# Patient Record
Sex: Male | Born: 1940
Health system: Southern US, Community
[De-identification: ages and names within clinical notes are randomized; demographics above are authoritative.]

## PROBLEM LIST (undated history)

## (undated) DIAGNOSIS — E785 Hyperlipidemia, unspecified: Secondary | ICD-10-CM

## (undated) DIAGNOSIS — E669 Obesity, unspecified: Secondary | ICD-10-CM

## (undated) DIAGNOSIS — E119 Type 2 diabetes mellitus without complications: Secondary | ICD-10-CM

## (undated) DIAGNOSIS — I10 Essential (primary) hypertension: Secondary | ICD-10-CM

## (undated) HISTORY — DX: Type 2 diabetes mellitus without complications: E11.9

---

## 1983-05-15 HISTORY — PX: FRACTURE SURGERY: SHX138

## 2013-09-02 DIAGNOSIS — I152 Hypertension secondary to endocrine disorders: Secondary | ICD-10-CM | POA: Insufficient documentation

## 2013-09-02 DIAGNOSIS — E1159 Type 2 diabetes mellitus with other circulatory complications: Secondary | ICD-10-CM | POA: Insufficient documentation

## 2014-05-17 DIAGNOSIS — N529 Male erectile dysfunction, unspecified: Secondary | ICD-10-CM | POA: Insufficient documentation

## 2014-12-22 DIAGNOSIS — M25512 Pain in left shoulder: Secondary | ICD-10-CM | POA: Diagnosis not present

## 2014-12-27 DIAGNOSIS — M25512 Pain in left shoulder: Secondary | ICD-10-CM | POA: Insufficient documentation

## 2014-12-27 DIAGNOSIS — G8929 Other chronic pain: Secondary | ICD-10-CM | POA: Insufficient documentation

## 2014-12-27 DIAGNOSIS — M542 Cervicalgia: Secondary | ICD-10-CM | POA: Diagnosis not present

## 2015-01-04 DIAGNOSIS — M7542 Impingement syndrome of left shoulder: Secondary | ICD-10-CM | POA: Insufficient documentation

## 2015-01-04 DIAGNOSIS — G8929 Other chronic pain: Secondary | ICD-10-CM | POA: Diagnosis not present

## 2015-01-04 DIAGNOSIS — M542 Cervicalgia: Secondary | ICD-10-CM | POA: Diagnosis not present

## 2015-01-07 ENCOUNTER — Other Ambulatory Visit: Payer: Self-pay | Admitting: Unknown Physician Specialty

## 2015-01-07 DIAGNOSIS — M542 Cervicalgia: Secondary | ICD-10-CM

## 2015-01-07 DIAGNOSIS — G8929 Other chronic pain: Secondary | ICD-10-CM

## 2015-01-14 ENCOUNTER — Ambulatory Visit
Admission: RE | Admit: 2015-01-14 | Discharge: 2015-01-14 | Disposition: A | Discharge status: Home / Self Care | Payer: Commercial Managed Care - HMO | Source: Ambulatory Visit | Attending: Unknown Physician Specialty | Admitting: Unknown Physician Specialty

## 2015-01-14 DIAGNOSIS — M4802 Spinal stenosis, cervical region: Secondary | ICD-10-CM | POA: Diagnosis not present

## 2015-01-14 DIAGNOSIS — M5030 Other cervical disc degeneration, unspecified cervical region: Secondary | ICD-10-CM | POA: Insufficient documentation

## 2015-01-14 DIAGNOSIS — M542 Cervicalgia: Secondary | ICD-10-CM | POA: Insufficient documentation

## 2015-01-14 DIAGNOSIS — G8929 Other chronic pain: Secondary | ICD-10-CM | POA: Insufficient documentation

## 2015-01-14 DIAGNOSIS — M5021 Other cervical disc displacement,  high cervical region: Secondary | ICD-10-CM | POA: Diagnosis not present

## 2015-03-23 DIAGNOSIS — H1131 Conjunctival hemorrhage, right eye: Secondary | ICD-10-CM | POA: Diagnosis not present

## 2015-04-22 DIAGNOSIS — Z0000 Encounter for general adult medical examination without abnormal findings: Secondary | ICD-10-CM | POA: Diagnosis not present

## 2015-04-22 DIAGNOSIS — Z125 Encounter for screening for malignant neoplasm of prostate: Secondary | ICD-10-CM | POA: Diagnosis not present

## 2015-12-09 DIAGNOSIS — E669 Obesity, unspecified: Secondary | ICD-10-CM | POA: Diagnosis not present

## 2015-12-09 DIAGNOSIS — Z1211 Encounter for screening for malignant neoplasm of colon: Secondary | ICD-10-CM | POA: Diagnosis not present

## 2015-12-09 DIAGNOSIS — Z1212 Encounter for screening for malignant neoplasm of rectum: Secondary | ICD-10-CM | POA: Diagnosis not present

## 2015-12-09 DIAGNOSIS — L989 Disorder of the skin and subcutaneous tissue, unspecified: Secondary | ICD-10-CM | POA: Diagnosis not present

## 2016-01-11 DIAGNOSIS — Z1211 Encounter for screening for malignant neoplasm of colon: Secondary | ICD-10-CM | POA: Diagnosis not present

## 2016-01-11 DIAGNOSIS — E669 Obesity, unspecified: Secondary | ICD-10-CM | POA: Diagnosis not present

## 2016-03-12 ENCOUNTER — Encounter: Payer: Self-pay | Admitting: *Deleted

## 2016-03-13 ENCOUNTER — Ambulatory Visit: Payer: Commercial Managed Care - HMO | Admitting: Anesthesiology

## 2016-03-13 ENCOUNTER — Ambulatory Visit
Admission: RE | Admit: 2016-03-13 | Discharge: 2016-03-13 | Disposition: A | Discharge status: Home / Self Care | Payer: Commercial Managed Care - HMO | Source: Ambulatory Visit | Attending: Gastroenterology | Admitting: Gastroenterology

## 2016-03-13 ENCOUNTER — Encounter
Admission: RE | Disposition: A | Discharge status: Home / Self Care | Payer: Self-pay | Source: Ambulatory Visit | Attending: Gastroenterology

## 2016-03-13 ENCOUNTER — Encounter: Payer: Self-pay | Admitting: *Deleted

## 2016-03-13 DIAGNOSIS — K5730 Diverticulosis of large intestine without perforation or abscess without bleeding: Secondary | ICD-10-CM | POA: Diagnosis not present

## 2016-03-13 DIAGNOSIS — Z1211 Encounter for screening for malignant neoplasm of colon: Secondary | ICD-10-CM | POA: Insufficient documentation

## 2016-03-13 DIAGNOSIS — D120 Benign neoplasm of cecum: Secondary | ICD-10-CM | POA: Insufficient documentation

## 2016-03-13 DIAGNOSIS — I10 Essential (primary) hypertension: Secondary | ICD-10-CM | POA: Diagnosis not present

## 2016-03-13 DIAGNOSIS — F17200 Nicotine dependence, unspecified, uncomplicated: Secondary | ICD-10-CM | POA: Insufficient documentation

## 2016-03-13 DIAGNOSIS — Z6829 Body mass index (BMI) 29.0-29.9, adult: Secondary | ICD-10-CM | POA: Diagnosis not present

## 2016-03-13 DIAGNOSIS — D122 Benign neoplasm of ascending colon: Secondary | ICD-10-CM | POA: Insufficient documentation

## 2016-03-13 DIAGNOSIS — K635 Polyp of colon: Secondary | ICD-10-CM | POA: Insufficient documentation

## 2016-03-13 DIAGNOSIS — E785 Hyperlipidemia, unspecified: Secondary | ICD-10-CM | POA: Insufficient documentation

## 2016-03-13 DIAGNOSIS — Z79899 Other long term (current) drug therapy: Secondary | ICD-10-CM | POA: Insufficient documentation

## 2016-03-13 DIAGNOSIS — K5790 Diverticulosis of intestine, part unspecified, without perforation or abscess without bleeding: Secondary | ICD-10-CM | POA: Diagnosis not present

## 2016-03-13 DIAGNOSIS — E669 Obesity, unspecified: Secondary | ICD-10-CM | POA: Diagnosis not present

## 2016-03-13 DIAGNOSIS — K621 Rectal polyp: Secondary | ICD-10-CM | POA: Diagnosis not present

## 2016-03-13 DIAGNOSIS — D128 Benign neoplasm of rectum: Secondary | ICD-10-CM | POA: Diagnosis not present

## 2016-03-13 DIAGNOSIS — D123 Benign neoplasm of transverse colon: Secondary | ICD-10-CM | POA: Diagnosis not present

## 2016-03-13 HISTORY — DX: Essential (primary) hypertension: I10

## 2016-03-13 HISTORY — PX: COLONOSCOPY WITH PROPOFOL: SHX5780

## 2016-03-13 HISTORY — DX: Hyperlipidemia, unspecified: E78.5

## 2016-03-13 HISTORY — DX: Obesity, unspecified: E66.9

## 2016-03-13 SURGERY — COLONOSCOPY WITH PROPOFOL
Anesthesia: General

## 2016-03-13 MED ORDER — SODIUM CHLORIDE 0.9 % IV SOLN
INTRAVENOUS | Status: DC
  Administered 2016-03-13: 1000 mL via INTRAVENOUS

## 2016-03-13 MED ORDER — EPHEDRINE SULFATE 50 MG/ML IJ SOLN
INTRAMUSCULAR | Status: DC | PRN
  Administered 2016-03-13 (×3): 10 mg via INTRAVENOUS

## 2016-03-13 MED ORDER — MIDAZOLAM HCL 2 MG/2ML IJ SOLN
INTRAMUSCULAR | Status: DC | PRN
  Administered 2016-03-13: 1 mg via INTRAVENOUS

## 2016-03-13 MED ORDER — FENTANYL CITRATE (PF) 100 MCG/2ML IJ SOLN
INTRAMUSCULAR | Status: DC | PRN
  Administered 2016-03-13: 50 ug via INTRAVENOUS

## 2016-03-13 MED ORDER — SODIUM CHLORIDE 0.9 % IV SOLN
INTRAVENOUS | Status: DC
  Administered 2016-03-13: 13:00:00 via INTRAVENOUS

## 2016-03-13 MED ORDER — LIDOCAINE HCL (CARDIAC) 20 MG/ML IV SOLN
INTRAVENOUS | Status: DC | PRN
  Administered 2016-03-13: 30 mg via INTRAVENOUS

## 2016-03-13 MED ORDER — PROPOFOL 500 MG/50ML IV EMUL
INTRAVENOUS | Status: DC | PRN
  Administered 2016-03-13: 120 ug/kg/min via INTRAVENOUS

## 2016-03-13 NOTE — Anesthesia Procedure Notes (Signed)
Performed by: COOK-MARTIN, Nicha Hemann Pre-anesthesia Checklist: Patient identified, Emergency Drugs available, Suction available, Patient being monitored and Timeout performed Patient Re-evaluated:Patient Re-evaluated prior to inductionOxygen Delivery Method: Nasal cannula Preoxygenation: Pre-oxygenation with 100% oxygen Intubation Type: IV induction Placement Confirmation: CO2 detector and positive ETCO2       

## 2016-03-13 NOTE — Op Note (Signed)
[facility] Gastroenterology Patient Name: Jeremiah Ford Procedure Date: 03/13/2016 2:12 PM MRN: [id number] Account #: [id number] Date of Birth: 1940-06-26 Admit Type: Outpatient Age: 75 Room: [facility] ENDO ROOM 3 Gender: Male Note Status: Finalized Procedure:            Colonoscopy Indications:          Screening for colorectal malignant neoplasm, This is                        the patient's first colonoscopy Providers:            Lollie Sails, MD Referring MD:         No Local Md, MD (Referring MD) Medicines:            Monitored Anesthesia Care Complications:        No immediate complications. Procedure:            Pre-Anesthesia Assessment:                       - ASA Grade Assessment: II - A patient with mild                        systemic disease.                       After obtaining informed consent, the colonoscope was                        passed under direct vision. Throughout the procedure,                        the patient's blood pressure, pulse, and oxygen                        saturations were monitored continuously. The                        Colonoscope was introduced through the anus and                        advanced to the the cecum, identified by appendiceal                        orifice and ileocecal valve. The quality of the bowel                        preparation was fair. Findings:      A 2 mm polyp was found in the hepatic flexure. The polyp was sessile.       The polyp was removed with a cold biopsy forceps. Resection and       retrieval were complete.      Two sessile polyps were found in the cecum. The polyps were 3 to 4 mm in       size. These polyps were removed with a cold snare. Resection and       retrieval were complete.      Four flat polyps were found in the proximal ascending colon. The polyps       were 1 to 3 mm in size. These polyps were removed with a cold biopsy       forceps. Resection and  retrieval were  complete.      Two sessile polyps were found in the cecum. The polyps were 1 to 3 mm in       size. These polyps were removed with a cold biopsy forceps. Resection       and retrieval were complete.      A 4 mm polyp was found in the recto-sigmoid colon. The polyp was       sessile. The polyp was removed with a cold snare. Resection and       retrieval were complete.      Three sessile polyps were found in the rectum. The polyps were 2 to 3 mm       in size. These polyps were removed with a cold biopsy forceps. Resection       and retrieval were complete.      The digital rectal exam was normal.      Multiple medium-mouthed diverticula were found in the sigmoid colon,       descending colon, transverse colon, ascending colon and cecum.      abnormal appearance of the appendix, the orifice being closed with a       puffy appearance circumferentially. Impression:           - Preparation of the colon was fair.                       - One 2 mm polyp at the hepatic flexure, removed with a                        cold biopsy forceps. Resected and retrieved.                       - Two 3 to 4 mm polyps in the cecum, removed with a                        cold snare. Resected and retrieved.                       - Four 1 to 3 mm polyps in the proximal ascending                        colon, removed with a cold biopsy forceps. Resected and                        retrieved.                       - Two 1 to 3 mm polyps in the cecum, removed with a                        cold biopsy forceps. Resected and retrieved.                       - One 4 mm polyp at the recto-sigmoid colon, removed                        with a cold snare. Resected and retrieved.                       - Three 2 to 3 mm polyps in the rectum, removed  with a                        cold biopsy forceps. Resected and retrieved.                       - Diverticulosis in the sigmoid colon, in the                        descending  colon, in the transverse colon, in the                        ascending colon and in the cecum. Recommendation:       - Await pathology results.                       - Telephone GI clinic for pathology results in 1 week.                       - Await pathology results.                       - Perform a CT scan (computed tomography) of abdomen                        with contrast and pelvis with contrast at appointment                        to be scheduled.                       - Return to GI clinic in 4 weeks. Procedure Code(s):    --- Professional ---                       [phone], Colonoscopy, flexible; with removal of tumor(s),                        polyp(s), or other lesion(s) by snare technique                       [id number], 71, Colonoscopy, flexible; with biopsy, single                        or multiple Diagnosis Code(s):    --- Professional ---                       Z12.11, Encounter for screening for malignant neoplasm                        of colon                       D12.3, Benign neoplasm of transverse colon (hepatic                        flexure or splenic flexure)                       D12.7, Benign neoplasm of rectosigmoid junction                       D12.2, Benign  neoplasm of ascending colon                       D12.0, Benign neoplasm of cecum                       K62.1, Rectal polyp                       K57.30, Diverticulosis of large intestine without                        perforation or abscess without bleeding CPT copyright 2016 American Medical Association. All rights reserved. The codes documented in this report are preliminary and upon coder review may  be revised to meet current compliance requirements. Lollie Sails, MD 03/13/2016 3:21:14 PM This report has been signed electronically. Number of Addenda: 0 Note Initiated On: 03/13/2016 2:12 PM Scope Withdrawal Time: 0 hours 39 minutes 44 seconds  Total Procedure Duration: 0 hours 50 minutes 41  seconds       [facility]

## 2016-03-13 NOTE — H&P (Signed)
Outpatient short stay form Pre-procedure 03/13/2016 2:05 PM Lollie Sails MD  Primary Physician: Dr Sherrin Daisy  Reason for visit:  Colonoscopy  History of present illness:  Patient is a 75 year old male presenting today as above. This is first colonoscopy. His prep well. He takes no recent aspirin products. He takes no blood thinners.    Current Facility-Administered Medications:  .  0.9 %  sodium chloride infusion, , Intravenous, Continuous, Lollie Sails, MD, Last Rate: 20 mL/hr at 03/13/16 1349, 1,000 mL at 03/13/16 1349 .  0.9 %  sodium chloride infusion, , Intravenous, Continuous, Lollie Sails, MD  Prescriptions Prior to Admission  Medication Sig Dispense Refill Last Dose  . acetaminophen-codeine (TYLENOL #3) 300-30 MG tablet Take by mouth every 4 (four) hours as needed for moderate pain.   Past Week at Unknown time  . triamterene-hydrochlorothiazide (DYAZIDE) 37.5-25 MG capsule Take 1 capsule by mouth daily.   03/12/2016 at Unknown time  . lovastatin (MEVACOR) 20 MG tablet Take 20 mg by mouth at bedtime.   Not Taking at Unknown time  . orlistat (XENICAL) 120 MG capsule Take 120 mg by mouth 3 (three) times daily with meals.   Not Taking at Unknown time     No Known Allergies   Past Medical History:  Diagnosis Date  . Hyperlipidemia   . Hypertension   . Obesity     Review of systems:      Physical Exam    Heart and lungs: Regular rate and rhythm without rub or gallop, lungs are bilaterally clear.    HEENT: Normocephalic atraumatic eyes are anicteric    Other:     Pertinant exam for procedure: Soft nontender nondistended bowel sounds positive normoactive.    Planned proceedures: Colonoscopy and indicated procedures.    Lollie Sails, MD Gastroenterology 03/13/2016  2:05 PM

## 2016-03-13 NOTE — Anesthesia Postprocedure Evaluation (Signed)
Anesthesia Post Note  Patient: Jeremiah Ford  Procedure(s) Performed: Procedure(s) (LRB): COLONOSCOPY WITH PROPOFOL (N/A)  Patient location during evaluation: PACU Anesthesia Type: General Level of consciousness: awake and alert and oriented Pain management: pain level controlled Vital Signs Assessment: post-procedure vital signs reviewed and stable Respiratory status: spontaneous breathing Cardiovascular status: blood pressure returned to baseline Anesthetic complications: no    Last Vitals:  Vitals:   03/13/16 1521 03/13/16 1551  BP: 114/68 117/70  Pulse: (!) 55 61  Resp: 18 16  Temp:      Last Pain:  Vitals:   03/13/16 1327  TempSrc: Tympanic                 Appollonia Klee

## 2016-03-13 NOTE — Transfer of Care (Signed)
Immediate Anesthesia Transfer of Care Note  Patient: Jeremiah Ford  Procedure(s) Performed: Procedure(s): COLONOSCOPY WITH PROPOFOL (N/A)  Patient Location: Endoscopy Unit  Anesthesia Type:General  Level of Consciousness: awake, alert , oriented and patient cooperative  Airway & Oxygen Therapy: Patient Spontanous Breathing and Patient connected to nasal cannula oxygen  Post-op Assessment: Report given to RN, Post -op Vital signs reviewed and stable and Patient moving all extremities X 4  Post vital signs: Reviewed and stable  Last Vitals:  Vitals:   03/13/16 1327  BP: 132/81  Pulse: 65  Resp: 18  Temp: 37.2 C    Last Pain:  Vitals:   03/13/16 1327  TempSrc: Tympanic         Complications: No apparent anesthesia complications

## 2016-03-13 NOTE — Anesthesia Preprocedure Evaluation (Signed)
Anesthesia Evaluation  Patient identified by MRN, date of birth, ID band Patient awake    Reviewed: Allergy & Precautions, NPO status , Patient's Chart, lab work & pertinent test results  History of Anesthesia Complications Negative for: history of anesthetic complications  Airway Mallampati: II  TM Distance: >3 FB Neck ROM: Full    Dental  (+) Upper Dentures, Lower Dentures   Pulmonary neg sleep apnea, neg COPD, Current Smoker,    breath sounds clear to auscultation- rhonchi (-) wheezing      Cardiovascular Exercise Tolerance: Good hypertension, Pt. on medications (-) CAD and (-) Past MI  Rhythm:Regular Rate:Normal - Systolic murmurs and - Diastolic murmurs    Neuro/Psych negative neurological ROS  negative psych ROS   GI/Hepatic negative GI ROS, Neg liver ROS,   Endo/Other  negative endocrine ROSneg diabetes  Renal/GU negative Renal ROS     Musculoskeletal negative musculoskeletal ROS (+)   Abdominal (+) - obese,   Peds  Hematology negative hematology ROS (+)   Anesthesia Other Findings Past Medical History: No date: Hyperlipidemia No date: Hypertension No date: Obesity   Reproductive/Obstetrics                             Anesthesia Physical Anesthesia Plan  ASA: II  Anesthesia Plan: General   Post-op Pain Management:    Induction: Intravenous  Airway Management Planned: Natural Airway  Additional Equipment:   Intra-op Plan:   Post-operative Plan:   Informed Consent: I have reviewed the patients History and Physical, chart, labs and discussed the procedure including the risks, benefits and alternatives for the proposed anesthesia with the patient or authorized representative who has indicated his/her understanding and acceptance.   Dental advisory given  Plan Discussed with: CRNA and Anesthesiologist  Anesthesia Plan Comments:         Anesthesia Quick  Evaluation

## 2016-03-14 ENCOUNTER — Encounter: Payer: Self-pay | Admitting: Gastroenterology

## 2016-03-15 ENCOUNTER — Other Ambulatory Visit: Payer: Self-pay | Admitting: Gastroenterology

## 2016-03-15 DIAGNOSIS — R1084 Generalized abdominal pain: Secondary | ICD-10-CM

## 2016-03-15 DIAGNOSIS — Q428 Congenital absence, atresia and stenosis of other parts of large intestine: Secondary | ICD-10-CM

## 2016-03-15 LAB — SURGICAL PATHOLOGY

## 2016-03-29 ENCOUNTER — Ambulatory Visit
Admission: RE | Admit: 2016-03-29 | Discharge: 2016-03-29 | Disposition: A | Discharge status: Home / Self Care | Payer: Commercial Managed Care - HMO | Source: Ambulatory Visit | Attending: Gastroenterology | Admitting: Gastroenterology

## 2016-03-29 DIAGNOSIS — I700 Atherosclerosis of aorta: Secondary | ICD-10-CM | POA: Diagnosis not present

## 2016-03-29 DIAGNOSIS — R1084 Generalized abdominal pain: Secondary | ICD-10-CM

## 2016-03-29 DIAGNOSIS — Q428 Congenital absence, atresia and stenosis of other parts of large intestine: Secondary | ICD-10-CM | POA: Diagnosis not present

## 2016-03-29 DIAGNOSIS — K5730 Diverticulosis of large intestine without perforation or abscess without bleeding: Secondary | ICD-10-CM | POA: Insufficient documentation

## 2016-03-29 DIAGNOSIS — N400 Enlarged prostate without lower urinary tract symptoms: Secondary | ICD-10-CM | POA: Diagnosis not present

## 2016-03-29 LAB — POCT I-STAT CREATININE: Creatinine, Ser: 1.1 mg/dL (ref 0.61–1.24)

## 2016-03-29 MED ORDER — IOPAMIDOL (ISOVUE-300) INJECTION 61%
100.0000 mL | Freq: Once | INTRAVENOUS | Status: AC | PRN
  Administered 2016-03-29: 100 mL via INTRAVENOUS

## 2016-04-24 ENCOUNTER — Other Ambulatory Visit: Payer: Self-pay | Admitting: Family Medicine

## 2016-04-24 DIAGNOSIS — Z860101 Personal history of adenomatous and serrated colon polyps: Secondary | ICD-10-CM | POA: Insufficient documentation

## 2016-04-24 DIAGNOSIS — Z136 Encounter for screening for cardiovascular disorders: Secondary | ICD-10-CM

## 2016-04-24 DIAGNOSIS — Z0000 Encounter for general adult medical examination without abnormal findings: Secondary | ICD-10-CM | POA: Diagnosis not present

## 2016-04-24 DIAGNOSIS — Z86010 Personal history of colonic polyps: Secondary | ICD-10-CM | POA: Insufficient documentation

## 2016-04-24 DIAGNOSIS — E279 Disorder of adrenal gland, unspecified: Secondary | ICD-10-CM | POA: Diagnosis not present

## 2016-04-24 DIAGNOSIS — D369 Benign neoplasm, unspecified site: Secondary | ICD-10-CM | POA: Diagnosis not present

## 2016-04-30 ENCOUNTER — Ambulatory Visit
Admission: RE | Admit: 2016-04-30 | Discharge: 2016-04-30 | Disposition: A | Discharge status: Home / Self Care | Payer: Commercial Managed Care - HMO | Source: Ambulatory Visit | Attending: Family Medicine | Admitting: Family Medicine

## 2016-04-30 DIAGNOSIS — Z136 Encounter for screening for cardiovascular disorders: Secondary | ICD-10-CM

## 2016-11-08 DIAGNOSIS — E782 Mixed hyperlipidemia: Secondary | ICD-10-CM | POA: Diagnosis not present

## 2016-11-08 DIAGNOSIS — Z86010 Personal history of colonic polyps: Secondary | ICD-10-CM | POA: Diagnosis not present

## 2016-11-08 DIAGNOSIS — Z0000 Encounter for general adult medical examination without abnormal findings: Secondary | ICD-10-CM | POA: Diagnosis not present

## 2016-11-08 DIAGNOSIS — E1169 Type 2 diabetes mellitus with other specified complication: Secondary | ICD-10-CM | POA: Insufficient documentation

## 2016-11-08 DIAGNOSIS — I10 Essential (primary) hypertension: Secondary | ICD-10-CM | POA: Diagnosis not present

## 2016-11-08 DIAGNOSIS — E785 Hyperlipidemia, unspecified: Secondary | ICD-10-CM | POA: Insufficient documentation

## 2016-11-08 DIAGNOSIS — I700 Atherosclerosis of aorta: Secondary | ICD-10-CM | POA: Insufficient documentation

## 2017-01-15 DIAGNOSIS — K1121 Acute sialoadenitis: Secondary | ICD-10-CM | POA: Diagnosis not present

## 2017-01-21 DIAGNOSIS — H60503 Unspecified acute noninfective otitis externa, bilateral: Secondary | ICD-10-CM | POA: Diagnosis not present

## 2017-02-25 DIAGNOSIS — R079 Chest pain, unspecified: Secondary | ICD-10-CM | POA: Diagnosis not present

## 2017-02-25 DIAGNOSIS — G8929 Other chronic pain: Secondary | ICD-10-CM | POA: Diagnosis not present

## 2017-02-25 DIAGNOSIS — M25552 Pain in left hip: Secondary | ICD-10-CM | POA: Diagnosis not present

## 2017-02-25 DIAGNOSIS — M1612 Unilateral primary osteoarthritis, left hip: Secondary | ICD-10-CM | POA: Diagnosis not present

## 2017-02-25 DIAGNOSIS — S20211A Contusion of right front wall of thorax, initial encounter: Secondary | ICD-10-CM | POA: Diagnosis not present

## 2017-02-25 DIAGNOSIS — R0789 Other chest pain: Secondary | ICD-10-CM | POA: Diagnosis not present

## 2017-05-02 DIAGNOSIS — E279 Disorder of adrenal gland, unspecified: Secondary | ICD-10-CM | POA: Insufficient documentation

## 2017-05-02 DIAGNOSIS — Z79899 Other long term (current) drug therapy: Secondary | ICD-10-CM | POA: Insufficient documentation

## 2017-05-02 DIAGNOSIS — E278 Other specified disorders of adrenal gland: Secondary | ICD-10-CM | POA: Insufficient documentation

## 2017-05-03 DIAGNOSIS — I700 Atherosclerosis of aorta: Secondary | ICD-10-CM | POA: Diagnosis not present

## 2017-05-03 DIAGNOSIS — E279 Disorder of adrenal gland, unspecified: Secondary | ICD-10-CM | POA: Diagnosis not present

## 2017-05-03 DIAGNOSIS — E782 Mixed hyperlipidemia: Secondary | ICD-10-CM | POA: Diagnosis not present

## 2017-05-03 DIAGNOSIS — R7301 Impaired fasting glucose: Secondary | ICD-10-CM | POA: Diagnosis not present

## 2017-05-03 DIAGNOSIS — Z79899 Other long term (current) drug therapy: Secondary | ICD-10-CM | POA: Diagnosis not present

## 2017-05-03 DIAGNOSIS — I10 Essential (primary) hypertension: Secondary | ICD-10-CM | POA: Diagnosis not present

## 2017-05-06 ENCOUNTER — Other Ambulatory Visit: Payer: Self-pay | Admitting: Internal Medicine

## 2017-05-06 DIAGNOSIS — E279 Disorder of adrenal gland, unspecified: Principal | ICD-10-CM

## 2017-05-06 DIAGNOSIS — E278 Other specified disorders of adrenal gland: Secondary | ICD-10-CM

## 2017-05-08 DIAGNOSIS — E1169 Type 2 diabetes mellitus with other specified complication: Secondary | ICD-10-CM | POA: Insufficient documentation

## 2017-05-21 ENCOUNTER — Ambulatory Visit
Admission: RE | Admit: 2017-05-21 | Discharge: 2017-05-21 | Disposition: A | Discharge status: Home / Self Care | Payer: Medicare HMO | Source: Ambulatory Visit | Attending: Internal Medicine | Admitting: Internal Medicine

## 2017-05-21 ENCOUNTER — Ambulatory Visit: Payer: Commercial Managed Care - HMO

## 2017-05-21 DIAGNOSIS — N200 Calculus of kidney: Secondary | ICD-10-CM | POA: Diagnosis not present

## 2017-05-21 DIAGNOSIS — E278 Other specified disorders of adrenal gland: Secondary | ICD-10-CM

## 2017-05-21 DIAGNOSIS — E279 Disorder of adrenal gland, unspecified: Secondary | ICD-10-CM | POA: Insufficient documentation

## 2017-05-21 DIAGNOSIS — D3501 Benign neoplasm of right adrenal gland: Secondary | ICD-10-CM | POA: Insufficient documentation

## 2018-04-01 DIAGNOSIS — L739 Follicular disorder, unspecified: Secondary | ICD-10-CM | POA: Diagnosis not present

## 2018-04-01 DIAGNOSIS — B356 Tinea cruris: Secondary | ICD-10-CM | POA: Diagnosis not present

## 2018-04-28 DIAGNOSIS — Z86010 Personal history of colonic polyps: Secondary | ICD-10-CM | POA: Diagnosis not present

## 2018-04-28 DIAGNOSIS — E1169 Type 2 diabetes mellitus with other specified complication: Secondary | ICD-10-CM | POA: Diagnosis not present

## 2018-04-28 DIAGNOSIS — Z0000 Encounter for general adult medical examination without abnormal findings: Secondary | ICD-10-CM | POA: Diagnosis not present

## 2018-04-28 DIAGNOSIS — E1159 Type 2 diabetes mellitus with other circulatory complications: Secondary | ICD-10-CM | POA: Diagnosis not present

## 2018-04-28 DIAGNOSIS — I10 Essential (primary) hypertension: Secondary | ICD-10-CM | POA: Diagnosis not present

## 2018-04-28 DIAGNOSIS — H903 Sensorineural hearing loss, bilateral: Secondary | ICD-10-CM | POA: Diagnosis not present

## 2018-04-28 DIAGNOSIS — Z79899 Other long term (current) drug therapy: Secondary | ICD-10-CM | POA: Diagnosis not present

## 2018-04-28 DIAGNOSIS — E279 Disorder of adrenal gland, unspecified: Secondary | ICD-10-CM | POA: Diagnosis not present

## 2018-04-28 DIAGNOSIS — I700 Atherosclerosis of aorta: Secondary | ICD-10-CM | POA: Diagnosis not present

## 2018-07-14 ENCOUNTER — Encounter: Payer: Self-pay | Admitting: *Deleted

## 2018-07-14 ENCOUNTER — Encounter: Payer: Medicare HMO | Attending: Internal Medicine | Admitting: *Deleted

## 2018-07-14 VITALS — BP 114/82 | Ht 72.0 in | Wt 236.1 lb

## 2018-07-14 DIAGNOSIS — E119 Type 2 diabetes mellitus without complications: Secondary | ICD-10-CM

## 2018-07-14 DIAGNOSIS — Z6832 Body mass index (BMI) 32.0-32.9, adult: Secondary | ICD-10-CM | POA: Diagnosis not present

## 2018-07-14 DIAGNOSIS — Z713 Dietary counseling and surveillance: Secondary | ICD-10-CM | POA: Insufficient documentation

## 2018-07-14 NOTE — Patient Instructions (Addendum)
Exercise: Begin walking for 10-15  minutes  3 days a week and gradually increase to 30 minutes 5 x week  Eat 3 meals day, 2 snacks a day Space meals 4-6 hours apart Allow 2-3 hours between meals and snacks Limit desserts/sweets and foods high in fat (chips, gravy) Avoid sugar sweetened drinks (soda, juices)  Make an eye doctor appointment  Return for appointment on:  Tuesday August 05, 2018 at 1:30 pm with Jaclyn Shaggy (dietitian)

## 2018-07-14 NOTE — Progress Notes (Signed)
Diabetes Self-Management Education  Visit Type: First/Initial  Appt. Start Time: 1530 Appt. End Time: 1630  07/14/2018  Mr. Jeremiah Ford, identified by name and date of birth, is a 78 y.o. male with a diagnosis of Diabetes: Type 2.   ASSESSMENT  Blood pressure 114/82, height 6' (1.829 m), weight 236 lb 1.6 oz (107.1 kg). Body mass index is 32.02 kg/m.  Diabetes Self-Management Education - 07/14/18 1553      Visit Information   Visit Type  First/Initial      Initial Visit   Diabetes Type  Type 2    Are you currently following a meal plan?  No    Are you taking your medications as prescribed?  No   not taking Flomax   Date Diagnosed  pt reports "I didn't know I had it" - A1C in Dec 2019 was 7.9 % but A1C in Dec 2018 was 6.8 %      Health Coping   How would you rate your overall health?  Good      Psychosocial Assessment   Patient Belief/Attitude about Diabetes  Other (comment)   "Not sure if I have it"   Self-care barriers  Hard of hearing    Self-management support  Doctor's office;Friends    Patient Concerns  Nutrition/Meal planning;Medication;Monitoring;Healthy Lifestyle;Problem Solving;Glycemic Control;Weight Control    Special Needs  None    Preferred Learning Style  Hands on    Learning Readiness  Contemplating    What is the last grade level you completed in school?  12th      Pre-Education Assessment   Patient understands the diabetes disease and treatment process.  Needs Instruction    Patient understands incorporating nutritional management into lifestyle.  Needs Instruction    Patient undertands incorporating physical activity into lifestyle.  Needs Instruction    Patient understands using medications safely.  Needs Instruction    Patient understands monitoring blood glucose, interpreting and using results  Needs Instruction    Patient understands prevention, detection, and treatment of acute complications.  Needs Instruction    Patient understands prevention,  detection, and treatment of chronic complications.  Needs Instruction    Patient understands how to develop strategies to address psychosocial issues.  Needs Instruction    Patient understands how to develop strategies to promote health/change behavior.  Needs Instruction      Complications   Last HgB A1C per patient/outside source  7.9 %   04/28/18   How often do you check your blood sugar?  Patient declines    Have you had a dilated eye exam in the past 12 months?  No    Have you had a dental exam in the past 12 months?  No    Are you checking your feet?  Yes    How many days per week are you checking your feet?  2      Dietary Intake   Breakfast  Goes to [employer] 5 x week for sausage and egg biscuit or gravy biscuit; sometimes has cheese toast    Snack (morning)  chips    Lunch  has chicken in crock pot with hot dogs, carrots, peas, green beans, tomatoes, corn, BBQ sauce    Snack (afternoon)  chips    Dinner  has same lunch and supper or sometimes ham sandwich with slaw or fish with baked potato, slaw and hush puppies    Snack (evening)  ice cream    Beverage(s)  water, coffee with cream, fruit juice, ginger  ale with liquor, soft drinks      Exercise   Exercise Type  ADL's      Patient Education   Previous Diabetes Education  No    Disease state   Definition of diabetes, type 1 and 2, and the diagnosis of diabetes;Factors that contribute to the development of diabetes    Nutrition management   Role of diet in the treatment of diabetes and the relationship between the three main macronutrients and blood glucose level    Physical activity and exercise   Role of exercise on diabetes management, blood pressure control and cardiac health.    Monitoring  Identified appropriate SMBG and/or A1C goals.    Chronic complications  Relationship between chronic complications and blood glucose control;Retinopathy and reason for yearly dilated eye exams    Psychosocial adjustment  Identified and  addressed patients feelings and concerns about diabetes      Individualized Goals (developed by patient)   Reducing Risk  Improve blood sugars Decrease medications Prevent diabetes complications Lose weight Lead a healthier lifestyle Become more fit     Outcomes   Expected Outcomes  Other (comment)   Demonstrated interest in learning. Expect minimal changes.      Individualized Plan for Diabetes Self-Management Training:   Learning Objective:  Patient will have a greater understanding of diabetes self-management. Patient education plan is to attend individual and/or group sessions per assessed needs and concerns.   Plan:   Patient Instructions  Exercise: Begin walking for 10-15  minutes  3 days a week and gradually increase to 30 minutes 5 x week Eat 3 meals day, 2 snacks a day Space meals 4-6 hours apart Allow 2-3 hours between meals and snacks Limit desserts/sweets and foods high in fat (chips, gravy) Avoid sugar sweetened drinks (soda, juices) Make an eye doctor appointment Return for appointment on:  Tuesday August 05, 2018 at 1:30 pm with Jaclyn Shaggy (dietitian)  Expected Outcomes:  Other (comment)(Demonstrated interest in learning. Expect minimal changes.)  Education material provided:  General Meal Planning Guidelines Simple Meal Plan  If problems or questions, patient to contact team via:   Johny Drilling, RN, [place], CDE [phone]  Future DSME appointment:  August 05, 2018 with the dietitian

## 2018-08-05 ENCOUNTER — Ambulatory Visit: Payer: Medicare HMO | Admitting: Dietician

## 2018-08-12 ENCOUNTER — Encounter: Payer: Self-pay | Admitting: Dietician

## 2018-09-11 DIAGNOSIS — R079 Chest pain, unspecified: Secondary | ICD-10-CM | POA: Insufficient documentation

## 2018-09-24 DIAGNOSIS — R0789 Other chest pain: Secondary | ICD-10-CM | POA: Diagnosis not present

## 2018-09-26 DIAGNOSIS — F17200 Nicotine dependence, unspecified, uncomplicated: Secondary | ICD-10-CM | POA: Insufficient documentation

## 2018-09-26 DIAGNOSIS — K029 Dental caries, unspecified: Secondary | ICD-10-CM | POA: Insufficient documentation

## 2018-09-26 DIAGNOSIS — E663 Overweight: Secondary | ICD-10-CM | POA: Insufficient documentation

## 2018-09-26 DIAGNOSIS — Z720 Tobacco use: Secondary | ICD-10-CM | POA: Insufficient documentation

## 2018-10-16 DIAGNOSIS — M5030 Other cervical disc degeneration, unspecified cervical region: Secondary | ICD-10-CM | POA: Diagnosis not present

## 2018-10-16 DIAGNOSIS — Z86010 Personal history of colonic polyps: Secondary | ICD-10-CM | POA: Diagnosis not present

## 2018-10-16 DIAGNOSIS — E278 Other specified disorders of adrenal gland: Secondary | ICD-10-CM | POA: Diagnosis not present

## 2018-10-16 DIAGNOSIS — E1169 Type 2 diabetes mellitus with other specified complication: Secondary | ICD-10-CM | POA: Diagnosis not present

## 2018-10-16 DIAGNOSIS — R079 Chest pain, unspecified: Secondary | ICD-10-CM | POA: Diagnosis not present

## 2018-10-16 DIAGNOSIS — Z79899 Other long term (current) drug therapy: Secondary | ICD-10-CM | POA: Diagnosis not present

## 2018-10-16 DIAGNOSIS — E1159 Type 2 diabetes mellitus with other circulatory complications: Secondary | ICD-10-CM | POA: Diagnosis not present

## 2018-10-16 DIAGNOSIS — E785 Hyperlipidemia, unspecified: Secondary | ICD-10-CM | POA: Diagnosis not present

## 2018-10-16 DIAGNOSIS — H903 Sensorineural hearing loss, bilateral: Secondary | ICD-10-CM | POA: Diagnosis not present

## 2018-10-16 DIAGNOSIS — I700 Atherosclerosis of aorta: Secondary | ICD-10-CM | POA: Diagnosis not present

## 2018-12-19 DIAGNOSIS — R079 Chest pain, unspecified: Secondary | ICD-10-CM | POA: Diagnosis not present

## 2018-12-19 DIAGNOSIS — N401 Enlarged prostate with lower urinary tract symptoms: Secondary | ICD-10-CM | POA: Diagnosis not present

## 2018-12-19 DIAGNOSIS — I10 Essential (primary) hypertension: Secondary | ICD-10-CM | POA: Diagnosis not present

## 2018-12-19 DIAGNOSIS — E1169 Type 2 diabetes mellitus with other specified complication: Secondary | ICD-10-CM | POA: Diagnosis not present

## 2018-12-19 DIAGNOSIS — Z79899 Other long term (current) drug therapy: Secondary | ICD-10-CM | POA: Diagnosis not present

## 2018-12-19 DIAGNOSIS — R350 Frequency of micturition: Secondary | ICD-10-CM | POA: Diagnosis not present

## 2018-12-19 DIAGNOSIS — E1159 Type 2 diabetes mellitus with other circulatory complications: Secondary | ICD-10-CM | POA: Diagnosis not present

## 2018-12-19 DIAGNOSIS — E785 Hyperlipidemia, unspecified: Secondary | ICD-10-CM | POA: Diagnosis not present

## 2019-01-05 ENCOUNTER — Other Ambulatory Visit: Payer: Self-pay

## 2019-01-05 DIAGNOSIS — H903 Sensorineural hearing loss, bilateral: Secondary | ICD-10-CM | POA: Insufficient documentation

## 2019-01-05 DIAGNOSIS — R350 Frequency of micturition: Secondary | ICD-10-CM

## 2019-01-06 ENCOUNTER — Other Ambulatory Visit
Admission: RE | Admit: 2019-01-06 | Discharge: 2019-01-06 | Disposition: A | Discharge status: Home / Self Care | Payer: Medicare HMO | Attending: Urology | Admitting: Urology

## 2019-01-06 ENCOUNTER — Ambulatory Visit (INDEPENDENT_AMBULATORY_CARE_PROVIDER_SITE_OTHER): Payer: Medicare HMO | Admitting: Urology

## 2019-01-06 ENCOUNTER — Encounter: Payer: Self-pay | Admitting: Urology

## 2019-01-06 ENCOUNTER — Other Ambulatory Visit: Payer: Self-pay

## 2019-01-06 VITALS — BP 130/74 | HR 55 | Ht 72.0 in | Wt 230.0 lb

## 2019-01-06 DIAGNOSIS — R350 Frequency of micturition: Secondary | ICD-10-CM | POA: Insufficient documentation

## 2019-01-06 DIAGNOSIS — N3281 Overactive bladder: Secondary | ICD-10-CM

## 2019-01-06 DIAGNOSIS — R300 Dysuria: Secondary | ICD-10-CM | POA: Diagnosis not present

## 2019-01-06 LAB — URINALYSIS, COMPLETE (UACMP) WITH MICROSCOPIC
Bilirubin Urine: NEGATIVE
Glucose, UA: NEGATIVE mg/dL
Ketones, ur: NEGATIVE mg/dL
Nitrite: NEGATIVE
Protein, ur: NEGATIVE mg/dL
Specific Gravity, Urine: 1.02 (ref 1.005–1.030)
Squamous Epithelial / LPF: NONE SEEN (ref 0–5)
pH: 7 (ref 5.0–8.0)

## 2019-01-06 LAB — BLADDER SCAN AMB NON-IMAGING

## 2019-01-06 NOTE — Patient Instructions (Signed)
Cystoscopy Cystoscopy is a procedure that is used to help diagnose and sometimes treat conditions that affect the lower urinary tract. The lower urinary tract includes the bladder and the urethra. The urethra is the tube that drains urine from the bladder. Cystoscopy is done using a thin, tube-shaped instrument with a light and camera at the end (cystoscope). The cystoscope may be hard or flexible, depending on the goal of the procedure. The cystoscope is inserted through the urethra, into the bladder. Cystoscopy may be recommended if you have:  Urinary tract infections that keep coming back.  Blood in the urine (hematuria).  An inability to control when you urinate (urinary incontinence) or an overactive bladder.  Unusual cells found in a urine sample.  A blockage in the urethra, such as a urinary stone.  Painful urination.  An abnormality in the bladder found during an intravenous pyelogram (IVP) or CT scan. Cystoscopy may also be done to remove a sample of tissue to be examined under a microscope (biopsy). Tell a health care provider about:  Any allergies you have.  All medicines you are taking, including vitamins, herbs, eye drops, creams, and over-the-counter medicines.  Any problems you or family members have had with anesthetic medicines.  Any blood disorders you have.  Any surgeries you have had.  Any medical conditions you have.  Whether you are pregnant or may be pregnant. What are the risks? Generally, this is a safe procedure. However, problems may occur, including:  Infection.  Bleeding.  Allergic reactions to medicines.  Damage to other structures or organs. What happens before the procedure?  Ask your health care provider about: ? Changing or stopping your regular medicines. This is especially important if you are taking diabetes medicines or blood thinners. ? Taking medicines such as aspirin and ibuprofen. These medicines can thin your blood. Do not take  these medicines unless your health care provider tells you to take them. ? Taking over-the-counter medicines, vitamins, herbs, and supplements.  Follow instructions from your health care provider about eating or drinking restrictions.  Ask your health care provider what steps will be taken to help prevent infection. These may include: ? Washing skin with a germ-killing soap. ? Taking antibiotic medicine.  You may have an exam or testing, such as: ? X-rays of the bladder, urethra, or kidneys. ? Urine tests to check for signs of infection.  Plan to have someone take you home from the hospital or clinic. What happens during the procedure?   You will be given one or more of the following: ? A medicine to help you relax (sedative). ? A medicine to numb the area (local anesthetic).  The area around the opening of your urethra will be cleaned.  The cystoscope will be passed through your urethra into your bladder.  Germ-free (sterile) fluid will flow through the cystoscope to fill your bladder. The fluid will stretch your bladder so that your health care provider can clearly examine your bladder walls.  Your doctor will look at the urethra and bladder. Your doctor may take a biopsy or remove stones.  The cystoscope will be removed, and your bladder will be emptied. The procedure may vary among health care providers and hospitals. What can I expect after the procedure? After the procedure, it is common to have:  Some soreness or pain in your abdomen and urethra.  Urinary symptoms. These include: ? Mild pain or burning when you urinate. Pain should stop within a few minutes after you urinate. This   may last for up to 1 week. ? A small amount of blood in your urine for several days. ? Feeling like you need to urinate but producing only a small amount of urine. Follow these instructions at home: Medicines  Take over-the-counter and prescription medicines only as told by your health care  provider.  If you were prescribed an antibiotic medicine, take it as told by your health care provider. Do not stop taking the antibiotic even if you start to feel better. General instructions  Return to your normal activities as told by your health care provider. Ask your health care provider what activities are safe for you.  Do not drive for 24 hours if you were given a sedative during your procedure.  Watch for any blood in your urine. If the amount of blood in your urine increases, call your health care provider.  Follow instructions from your health care provider about eating or drinking restrictions.  If a tissue sample was removed for testing (biopsy) during your procedure, it is up to you to get your test results. Ask your health care provider, or the department that is doing the test, when your results will be ready.  Drink enough fluid to keep your urine pale yellow.  Keep all follow-up visits as told by your health care provider. This is important. Contact a health care provider if you:  Have pain that gets worse or does not get better with medicine, especially pain when you urinate.  Have trouble urinating.  Have more blood in your urine. Get help right away if you:  Have blood clots in your urine.  Have abdominal pain.  Have a fever or chills.  Are unable to urinate. Summary  Cystoscopy is a procedure that is used to help diagnose and sometimes treat conditions that affect the lower urinary tract.  Cystoscopy is done using a thin, tube-shaped instrument with a light and camera at the end.  After the procedure, it is common to have some soreness or pain in your abdomen and urethra.  Watch for any blood in your urine. If the amount of blood in your urine increases, call your health care provider.  If you were prescribed an antibiotic medicine, take it as told by your health care provider. Do not stop taking the antibiotic even if you start to feel better. This  information is not intended to replace advice given to you by your health care provider. Make sure you discuss any questions you have with your health care provider. Document Released: 04/27/2000 Document Revised: 04/22/2018 Document Reviewed: 04/22/2018 Elsevier Patient Education  2020 [employer]  

## 2019-01-06 NOTE — Progress Notes (Signed)
   01/06/19 11:29 AM   Cornelius Moras 1940-08-06 [id number]  Referring provider: Ezequiel Kayser, MD [facility] [place],  [place] [address]  CC: Dysuria  HPI: I saw Mr. Sawhney in urology clinic today in consultation for dysuria from Dr. Raechel Ache.  He is a 78 year old male with past medical history notable for diabetes and hypertension who presents with a few months of dysuria.  This improves when he takes his Flomax.  He also reports minimally bothersome urgency and frequency during the day.  He drinks primarily water.  Once a week he will have an alcoholic drink which significantly worsens his urgency and frequency.  He has a 30-pack-year smoking history and continues to smoke.  He also reports a distant history of STD.  He is not sexually active and denies any recent STDs.  He denies any gross hematuria.  Severity is mild.  PVR 0 mL.  Urinalysis today small leukocytes, rare bacteria, 0-5 RBCs, 6-10 WBCs.  HgA1c 7.1, PSA 3.24.   PMH: Past Medical History:  Diagnosis Date  . Diabetes mellitus without complication ([employer])   . Hyperlipidemia   . Hypertension   . Obesity     Surgical History: Past Surgical History:  Procedure Laterality Date  . COLONOSCOPY WITH PROPOFOL N/A 03/13/2016   Procedure: COLONOSCOPY WITH PROPOFOL;  Surgeon: Lollie Sails, MD;  Location: [facility]COPY;  Service: Endoscopy;  Laterality: N/A;  . FRACTURE SURGERY Right 1985   upper leg    Allergies: No Known Allergies  Social History:  reports that he has been smoking cigars and cigarettes. He has a 15.00 pack-year smoking history. His smokeless tobacco use includes chew. He reports current alcohol use of about 2.0 standard drinks of alcohol per week. He reports that he does not use drugs.  ROS: Please see flowsheet from today's date for complete review of systems.  Physical Exam: BP 130/74   Pulse (!) 55   Ht 6' (1.829 m)   Wt 230 lb (104.3 kg)   BMI 31.19 kg/m     Constitutional:  Alert and oriented, No acute distress. Cardiovascular: No clubbing, cyanosis, or edema. Respiratory: Normal respiratory effort, no increased work of breathing. GI: Abdomen is soft, nontender, nondistended, no abdominal masses GU:No CVA tenderness, uncircumcised phallus without lesions, widely patent meatus Lymph: No cervical or inguinal lymphadenopathy. Skin: No rashes, bruises or suspicious lesions. Neurologic: Grossly intact, no focal deficits, moving all 4 extremities. Psychiatric: Normal mood and affect.  Laboratory Data: Reviewed, see HPI  Pertinent Imaging: I have personally reviewed the CT from 2019.  Prostate measures 77 g.  No hydronephrosis.  Stable adrenal adenomas.  Assessment & Plan:   In summary, Mr. Charnley is a 78 year old male with diabetes who reports occasional urinary urgency and frequency, as well as dysuria.  The symptoms do improve on Flomax.  We discussed possible etiologies of his urinary symptoms including BPH, OAB, carcinoma in situ/bladder malignancy, or urethral stricture.  With his irritative symptoms and extensive smoking history, I recommended cystoscopy to rule out urethral stricture or bladder lesions.  We will also send urine for culture today.  Continue Flomax Follow-up urine culture RTC for cystoscopy to rule out urethral stricture/bladder pathology  Billey Co, MD  [facility] [address], [place], [place] [address] [phone]

## 2019-01-07 LAB — URINE CULTURE: Culture: <10000 — AB

## 2019-01-08 ENCOUNTER — Telehealth: Payer: Self-pay | Admitting: *Deleted

## 2019-01-08 NOTE — Telephone Encounter (Signed)
Left message

## 2019-01-08 NOTE — Telephone Encounter (Signed)
-----   Message from Billey Co, MD sent at 01/08/2019  8:36 AM EDT ----- No infection on urine culture, keep follow up as scheduled  Nickolas Madrid, MD 01/08/2019

## 2019-01-16 ENCOUNTER — Other Ambulatory Visit: Payer: Self-pay | Admitting: *Deleted

## 2019-01-16 DIAGNOSIS — R350 Frequency of micturition: Secondary | ICD-10-CM

## 2019-01-20 ENCOUNTER — Other Ambulatory Visit: Payer: Self-pay

## 2019-01-20 ENCOUNTER — Encounter: Payer: Self-pay | Admitting: Urology

## 2019-01-20 ENCOUNTER — Other Ambulatory Visit: Payer: Medicare HMO | Admitting: Urology

## 2019-01-20 ENCOUNTER — Ambulatory Visit (INDEPENDENT_AMBULATORY_CARE_PROVIDER_SITE_OTHER): Payer: Medicare HMO | Admitting: Urology

## 2019-01-20 ENCOUNTER — Other Ambulatory Visit
Admission: RE | Admit: 2019-01-20 | Discharge: 2019-01-20 | Disposition: A | Discharge status: Home / Self Care | Payer: Medicare HMO | Attending: Urology | Admitting: Urology

## 2019-01-20 VITALS — BP 138/89 | HR 59 | Ht 72.0 in | Wt 230.0 lb

## 2019-01-20 DIAGNOSIS — R350 Frequency of micturition: Secondary | ICD-10-CM | POA: Diagnosis not present

## 2019-01-20 DIAGNOSIS — N3281 Overactive bladder: Secondary | ICD-10-CM

## 2019-01-20 DIAGNOSIS — R300 Dysuria: Secondary | ICD-10-CM

## 2019-01-20 LAB — URINALYSIS, COMPLETE (UACMP) WITH MICROSCOPIC
Bacteria, UA: NONE SEEN
Bilirubin Urine: NEGATIVE
Glucose, UA: NEGATIVE mg/dL
Ketones, ur: NEGATIVE mg/dL
Nitrite: NEGATIVE
Protein, ur: NEGATIVE mg/dL
Specific Gravity, Urine: 1.02 (ref 1.005–1.030)
Squamous Epithelial / HPF: NONE SEEN (ref 0–5)
pH: 7.5 (ref 5.0–8.0)

## 2019-01-20 MED ORDER — LIDOCAINE HCL URETHRAL/MUCOSAL 2 % EX GEL
1.0000 "application " | Freq: Once | CUTANEOUS | Status: AC
  Administered 2019-01-20: 1 via URETHRAL

## 2019-01-20 MED ORDER — TAMSULOSIN HCL 0.4 MG PO CAPS: 0.4000 mg | ORAL_CAPSULE | Freq: Every day | ORAL | 11 refills | Status: DC

## 2019-01-20 NOTE — Progress Notes (Signed)
Cystoscopy Procedure Note:  Indication: Dysuria and extensive smoking history  After informed consent and discussion of the procedure and its risks, Jeremiah Ford was positioned and prepped in the standard fashion. Cystoscopy was performed with a flexible cystoscope. The urethra, bladder neck and entire bladder was visualized in a standard fashion. The prostate was large with a high bladder neck. The bladder mucosa was grossly normal throughout with no worrisome lesions.  Findings: Large prostate, measures ~80g on recent CT No stricture or worrisome bladder lesions  Assessment and Plan: Continue flomax daily RTC one year for symptom check  Nickolas Madrid, MD 01/20/2019

## 2019-01-20 NOTE — Patient Instructions (Signed)
Cystoscopy Cystoscopy is a procedure that is used to help diagnose and sometimes treat conditions that affect the lower urinary tract. The lower urinary tract includes the bladder and the urethra. The urethra is the tube that drains urine from the bladder. Cystoscopy is done using a thin, tube-shaped instrument with a light and camera at the end (cystoscope). The cystoscope may be hard or flexible, depending on the goal of the procedure. The cystoscope is inserted through the urethra, into the bladder. Cystoscopy may be recommended if you have:  Urinary tract infections that keep coming back.  Blood in the urine (hematuria).  An inability to control when you urinate (urinary incontinence) or an overactive bladder.  Unusual cells found in a urine sample.  A blockage in the urethra, such as a urinary stone.  Painful urination.  An abnormality in the bladder found during an intravenous pyelogram (IVP) or CT scan. Cystoscopy may also be done to remove a sample of tissue to be examined under a microscope (biopsy). Tell a health care provider about:  Any allergies you have.  All medicines you are taking, including vitamins, herbs, eye drops, creams, and over-the-counter medicines.  Any problems you or family members have had with anesthetic medicines.  Any blood disorders you have.  Any surgeries you have had.  Any medical conditions you have.  Whether you are pregnant or may be pregnant. What are the risks? Generally, this is a safe procedure. However, problems may occur, including:  Infection.  Bleeding.  Allergic reactions to medicines.  Damage to other structures or organs. What happens before the procedure?  Ask your health care provider about: ? Changing or stopping your regular medicines. This is especially important if you are taking diabetes medicines or blood thinners. ? Taking medicines such as aspirin and ibuprofen. These medicines can thin your blood. Do not take  these medicines unless your health care provider tells you to take them. ? Taking over-the-counter medicines, vitamins, herbs, and supplements.  Follow instructions from your health care provider about eating or drinking restrictions.  Ask your health care provider what steps will be taken to help prevent infection. These may include: ? Washing skin with a germ-killing soap. ? Taking antibiotic medicine.  You may have an exam or testing, such as: ? X-rays of the bladder, urethra, or kidneys. ? Urine tests to check for signs of infection.  Plan to have someone take you home from the hospital or clinic. What happens during the procedure?   You will be given one or more of the following: ? A medicine to help you relax (sedative). ? A medicine to numb the area (local anesthetic).  The area around the opening of your urethra will be cleaned.  The cystoscope will be passed through your urethra into your bladder.  Germ-free (sterile) fluid will flow through the cystoscope to fill your bladder. The fluid will stretch your bladder so that your health care provider can clearly examine your bladder walls.  Your doctor will look at the urethra and bladder. Your doctor may take a biopsy or remove stones.  The cystoscope will be removed, and your bladder will be emptied. The procedure may vary among health care providers and hospitals. What can I expect after the procedure? After the procedure, it is common to have:  Some soreness or pain in your abdomen and urethra.  Urinary symptoms. These include: ? Mild pain or burning when you urinate. Pain should stop within a few minutes after you urinate. This   may last for up to 1 week. ? A small amount of blood in your urine for several days. ? Feeling like you need to urinate but producing only a small amount of urine. Follow these instructions at home: Medicines  Take over-the-counter and prescription medicines only as told by your health care  provider.  If you were prescribed an antibiotic medicine, take it as told by your health care provider. Do not stop taking the antibiotic even if you start to feel better. General instructions  Return to your normal activities as told by your health care provider. Ask your health care provider what activities are safe for you.  Do not drive for 24 hours if you were given a sedative during your procedure.  Watch for any blood in your urine. If the amount of blood in your urine increases, call your health care provider.  Follow instructions from your health care provider about eating or drinking restrictions.  If a tissue sample was removed for testing (biopsy) during your procedure, it is up to you to get your test results. Ask your health care provider, or the department that is doing the test, when your results will be ready.  Drink enough fluid to keep your urine pale yellow.  Keep all follow-up visits as told by your health care provider. This is important. Contact a health care provider if you:  Have pain that gets worse or does not get better with medicine, especially pain when you urinate.  Have trouble urinating.  Have more blood in your urine. Get help right away if you:  Have blood clots in your urine.  Have abdominal pain.  Have a fever or chills.  Are unable to urinate. Summary  Cystoscopy is a procedure that is used to help diagnose and sometimes treat conditions that affect the lower urinary tract.  Cystoscopy is done using a thin, tube-shaped instrument with a light and camera at the end.  After the procedure, it is common to have some soreness or pain in your abdomen and urethra.  Watch for any blood in your urine. If the amount of blood in your urine increases, call your health care provider.  If you were prescribed an antibiotic medicine, take it as told by your health care provider. Do not stop taking the antibiotic even if you start to feel better. This  information is not intended to replace advice given to you by your health care provider. Make sure you discuss any questions you have with your health care provider. Document Released: 04/27/2000 Document Revised: 04/22/2018 Document Reviewed: 04/22/2018 Elsevier Patient Education  2020 [employer]  

## 2019-03-04 IMAGING — CT CT ABDOMEN W/O CM
2 of 4 series · 16 of 46 positions shown, 18 images · non-contrast
Comparison: 03/29/2016

CLINICAL DATA: Followup indeterminate bilateral adrenal masses.

EXAM:
CT ABDOMEN WITHOUT CONTRAST
TECHNIQUE: Multidetector CT imaging of the abdomen was performed following the
standard protocol without IV contrast.

[Series 2: axial st · axial · 0.91mm/px · z∈[-451,-172]mm · 13 of 103 slices shown, 15 images]
[im 5/103  soft-tissue]
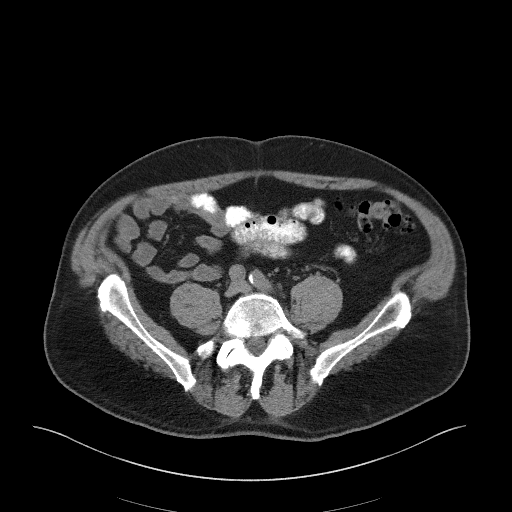
[im 5/103  bone]
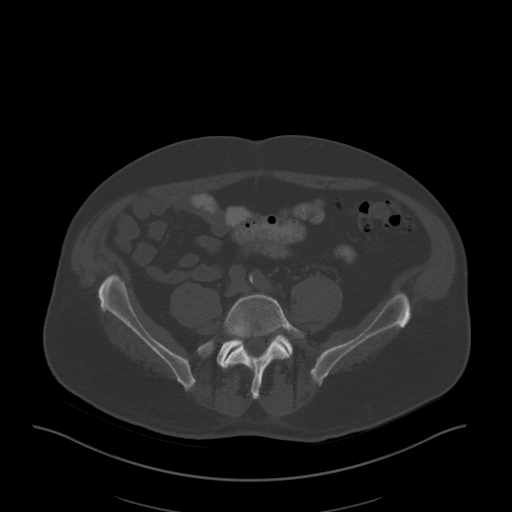
[im 14/103  soft-tissue]
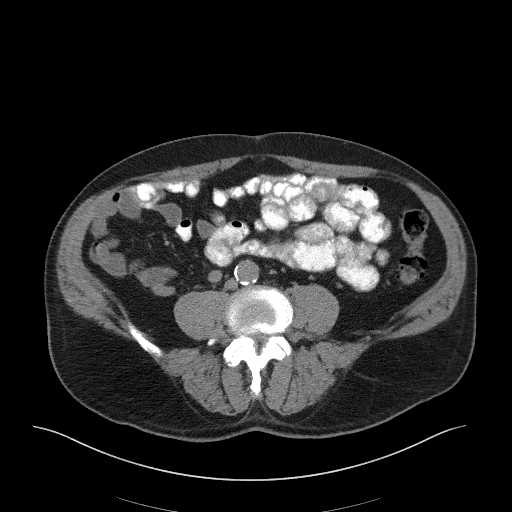
[im 23/103  soft-tissue]
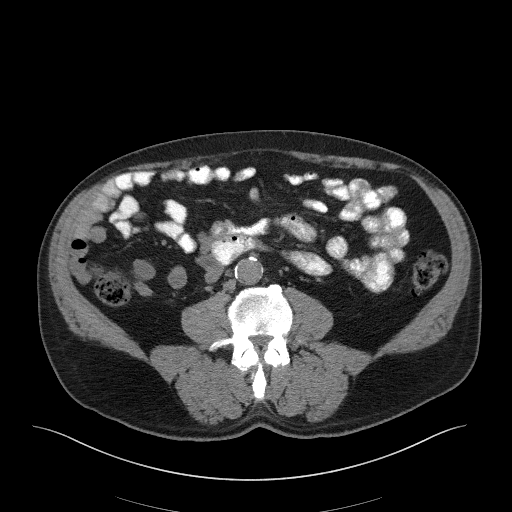
[im 27/103  soft-tissue]
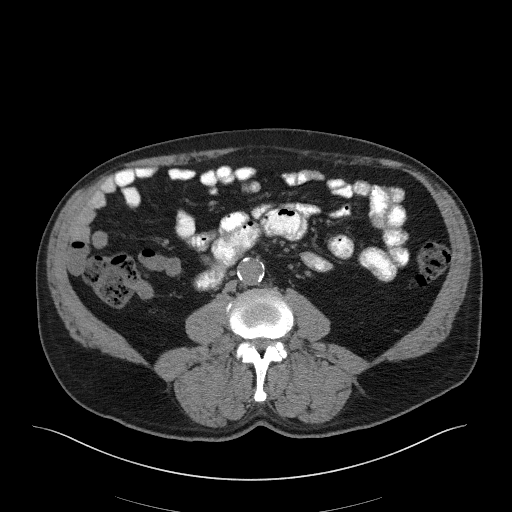
[im 36/103  soft-tissue]
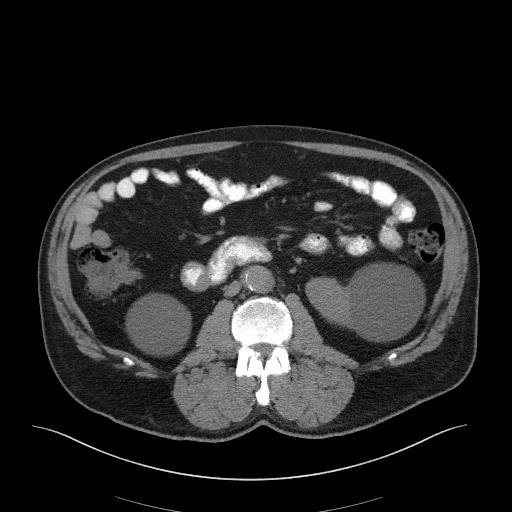
[im 45/103  soft-tissue]
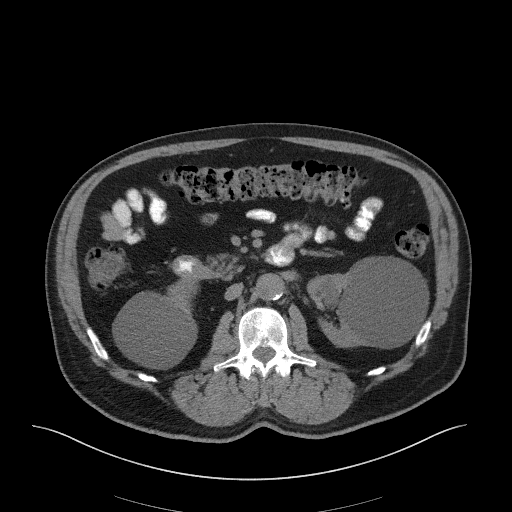
[im 54/103  soft-tissue]
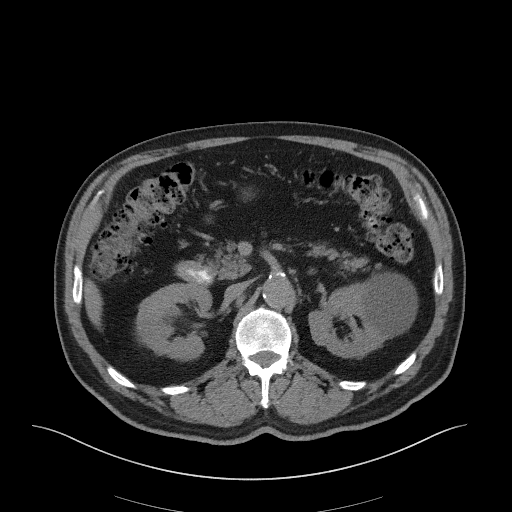
[im 58/103  soft-tissue]
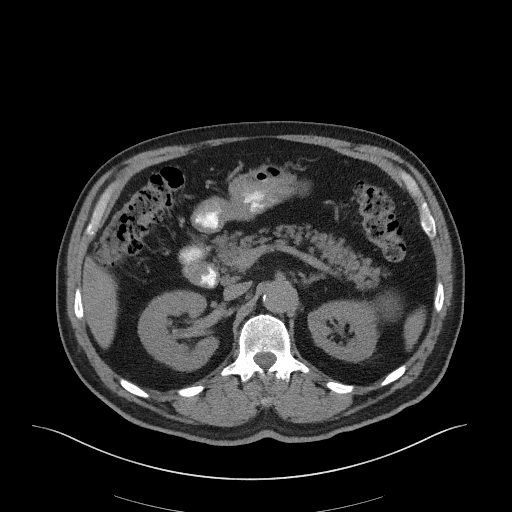
[im 67/103  soft-tissue]
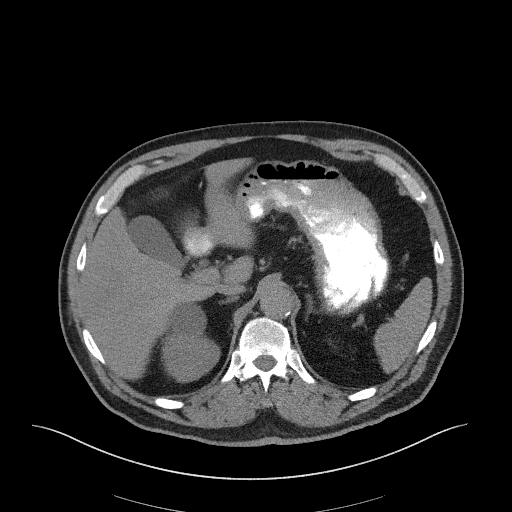
[im 67/103  bone]
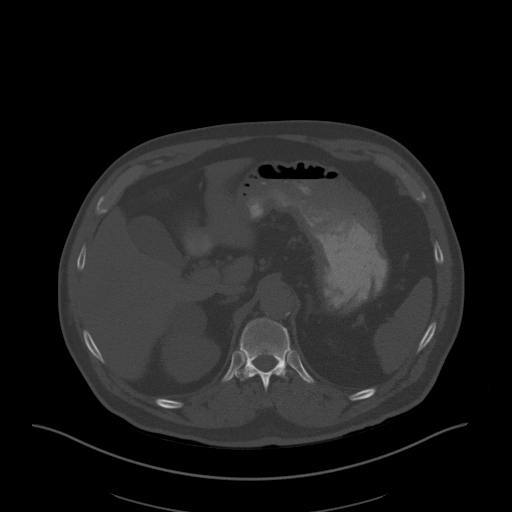
[im 76/103  soft-tissue]
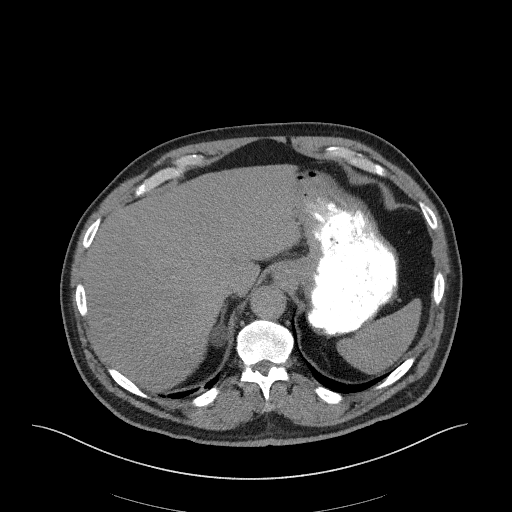
[im 80/103  soft-tissue]
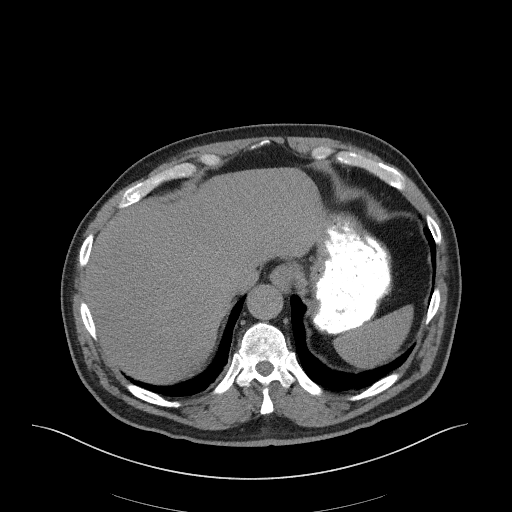
[im 89/103  soft-tissue]
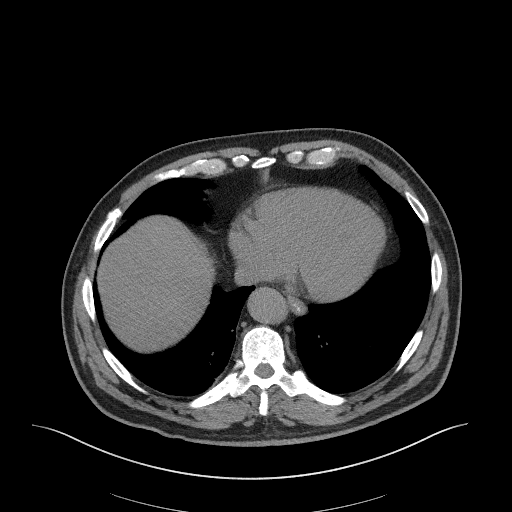
[im 98/103  soft-tissue]
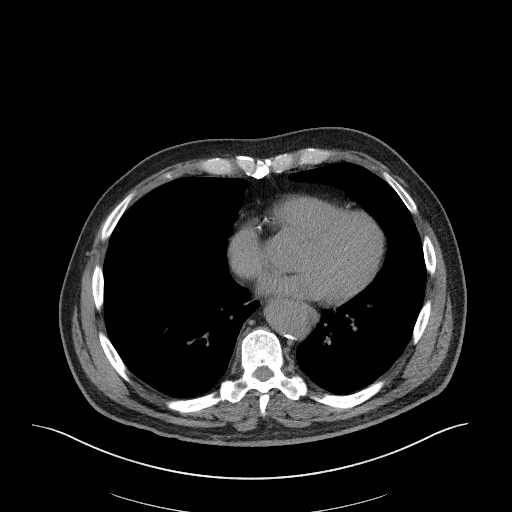

[Series 5: coronal wo · coronal · 0.60mm/px · 3 of 144 slices shown]
[im 48/144  soft-tissue]
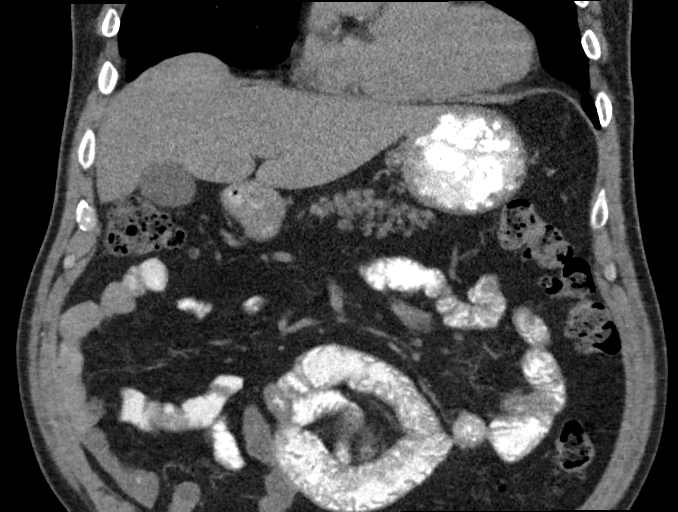
[im 64/144  soft-tissue]
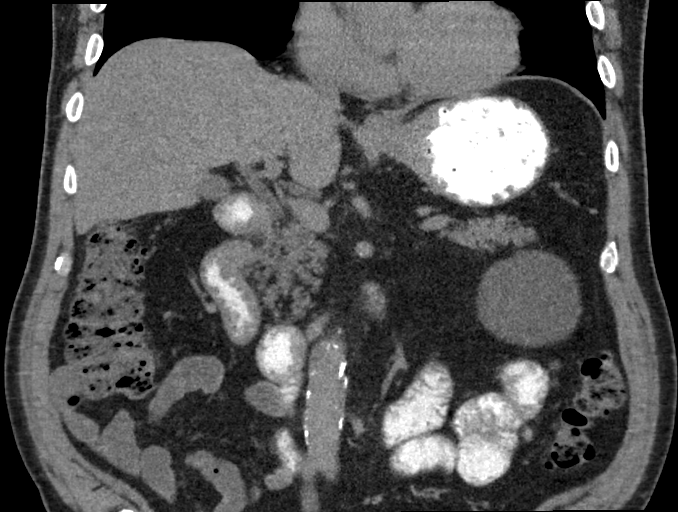
[im 80/144  soft-tissue]
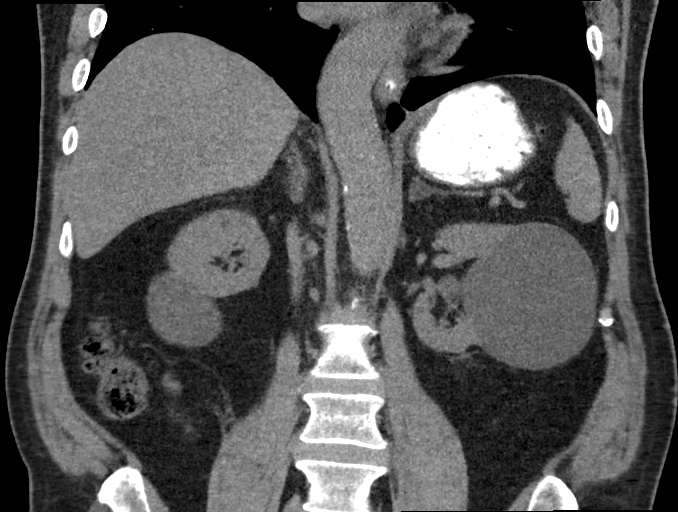

[16 of 46 positions shown; findings below may reference images not displayed]

FINDINGS: Lower chest: No acute findings.

Hepatobiliary: 1.5 cm low-attenuation lesion in the lateral aspect
of segment 8 appears stable since previous study, consistent with
benign etiology. No other liver lesions are identified on this
unenhanced exam. Gallbladder is unremarkable.

Pancreas: No mass or inflammatory process visualized on this
unenhanced exam.

Spleen:  Within normal limits in size.

Adrenals/Urinary tract: 2 cm right adrenal mass and 1.4 cm left
adrenal mass are stable compared to previous study. Both have
attenuation values consistent with benign adrenal adenomas.

Fluid attenuation bilateral renal cysts show no significant change.
A 3 mm nonobstructing calculus is seen in the lower pole of the left
kidney. No evidence of hydronephrosis.

Stomach/Bowel: Colonic diverticulosis is seen, without evidence of
diverticulitis in visualized portion of colon. No evidence of
dilated bowel loops, inflammatory process, or abnormal fluid
collections.

Vascular/Lymphatic: No pathologically enlarged lymph nodes
identified. No evidence of abdominal aortic aneurysm. Aortic
atherosclerosis.

Other:  None.

Musculoskeletal:  No suspicious bone lesions identified.
IMPRESSION: Stable benign bilateral adrenal adenomas.

3 mm nonobstructing left renal calculus. No evidence of
hydronephrosis or other acute findings.

## 2019-03-17 DIAGNOSIS — E785 Hyperlipidemia, unspecified: Secondary | ICD-10-CM | POA: Diagnosis not present

## 2019-03-17 DIAGNOSIS — I10 Essential (primary) hypertension: Secondary | ICD-10-CM | POA: Diagnosis not present

## 2019-03-17 DIAGNOSIS — E1169 Type 2 diabetes mellitus with other specified complication: Secondary | ICD-10-CM | POA: Diagnosis not present

## 2019-03-17 DIAGNOSIS — E1159 Type 2 diabetes mellitus with other circulatory complications: Secondary | ICD-10-CM | POA: Diagnosis not present

## 2019-03-17 DIAGNOSIS — R079 Chest pain, unspecified: Secondary | ICD-10-CM | POA: Diagnosis not present

## 2019-03-17 DIAGNOSIS — I700 Atherosclerosis of aorta: Secondary | ICD-10-CM | POA: Diagnosis not present

## 2019-03-17 DIAGNOSIS — F17200 Nicotine dependence, unspecified, uncomplicated: Secondary | ICD-10-CM | POA: Diagnosis not present

## 2019-04-30 DIAGNOSIS — E1169 Type 2 diabetes mellitus with other specified complication: Secondary | ICD-10-CM | POA: Diagnosis not present

## 2019-04-30 DIAGNOSIS — F17210 Nicotine dependence, cigarettes, uncomplicated: Secondary | ICD-10-CM | POA: Diagnosis not present

## 2019-04-30 DIAGNOSIS — E785 Hyperlipidemia, unspecified: Secondary | ICD-10-CM | POA: Diagnosis not present

## 2019-04-30 DIAGNOSIS — E1159 Type 2 diabetes mellitus with other circulatory complications: Secondary | ICD-10-CM | POA: Diagnosis not present

## 2019-04-30 DIAGNOSIS — Z0000 Encounter for general adult medical examination without abnormal findings: Secondary | ICD-10-CM | POA: Diagnosis not present

## 2019-04-30 DIAGNOSIS — I10 Essential (primary) hypertension: Secondary | ICD-10-CM | POA: Diagnosis not present

## 2019-04-30 DIAGNOSIS — Z79899 Other long term (current) drug therapy: Secondary | ICD-10-CM | POA: Diagnosis not present

## 2019-06-05 ENCOUNTER — Telehealth: Payer: Self-pay | Admitting: Urology

## 2019-06-05 DIAGNOSIS — N3281 Overactive bladder: Secondary | ICD-10-CM

## 2019-06-05 DIAGNOSIS — R350 Frequency of micturition: Secondary | ICD-10-CM

## 2019-06-05 MED ORDER — TAMSULOSIN HCL 0.4 MG PO CAPS: 0.4000 mg | ORAL_CAPSULE | Freq: Every day | ORAL | 2 refills | Status: AC

## 2019-06-05 NOTE — Telephone Encounter (Signed)
Rx moved from [facility] to [place]. Called pt and informed him of the change. Pt gave verbal understanding.

## 2019-06-05 NOTE — Telephone Encounter (Signed)
Pt states he needs his Tamsulosin to be sent to [facility] instead of [place], pt is getting low (only 3 days left) Pt asks for a quanty of 90 at a time. Please advise pt of timeline as he is asking how long this will take and doesn't want to have to call back. I advised pt of 24-48 hr turn around time and cma are in clinic today with pts as well.  Thanks.

## 2019-12-18 ENCOUNTER — Other Ambulatory Visit: Payer: Self-pay | Admitting: Urology

## 2019-12-18 DIAGNOSIS — R350 Frequency of micturition: Secondary | ICD-10-CM

## 2019-12-18 DIAGNOSIS — N3281 Overactive bladder: Secondary | ICD-10-CM

## 2020-01-26 ENCOUNTER — Ambulatory Visit: Payer: Medicare HMO | Admitting: Urology

## 2022-10-17 ENCOUNTER — Encounter (INDEPENDENT_AMBULATORY_CARE_PROVIDER_SITE_OTHER): Payer: Medicare PPO | Admitting: Family Medicine

## 2023-10-26 ENCOUNTER — Ambulatory Visit (INDEPENDENT_AMBULATORY_CARE_PROVIDER_SITE_OTHER)

## 2023-10-26 ENCOUNTER — Ambulatory Visit: Admission: EM | Admit: 2023-10-26 | Discharge: 2023-10-26 | Disposition: A | Discharge status: Home / Self Care

## 2023-10-26 DIAGNOSIS — M25551 Pain in right hip: Secondary | ICD-10-CM

## 2023-10-26 NOTE — ED Provider Notes (Signed)
 Jeremiah Ford    CSN: [id number] Arrival date & time: 10/26/23  1237      History   Chief Complaint Chief Complaint  Patient presents with   Hip Pain    HPI Jeremiah Ford is a 83 y.o. male.  Patient presents with 2-week history of right hip pain.  No falls or recent injury.  The pain is worse with weightbearing.  No numbness, weakness, bruising, rash, wounds.  He has history of right hip surgery in 1985.  He has been taking ibuprofen for the discomfort which has helped.  The history is provided by the patient and medical records.    Past Medical History:  Diagnosis Date   Diabetes mellitus without complication (HCC)    Hyperlipidemia    Hypertension    Obesity     Patient Active Problem List   Diagnosis Date Noted   Sensorineural hearing loss (SNHL) of both ears 01/05/2019   Benign prostatic hyperplasia with urinary frequency 12/19/2018   Chews tobacco 09/26/2018   Dental caries 09/26/2018   Overweight 09/26/2018   Smokes tobacco daily 09/26/2018   Chest pain with moderate risk of acute coronary syndrome 09/11/2018   Type 2 diabetes mellitus with other specified complication (HCC) 05/08/2017   Adrenal nodule (HCC) 05/02/2017   High risk medication use 05/02/2017   Aortic atherosclerosis (HCC) 11/08/2016   Hyperlipidemia associated with type 2 diabetes mellitus (HCC) 11/08/2016   History of adenomatous polyp of colon 04/24/2016   Degenerative disc disease, cervical 01/14/2015   Impingement syndrome of left shoulder 01/04/2015   Acute pain of left shoulder 12/27/2014   Ongoing cervical pain 12/27/2014   ED (erectile dysfunction) of organic origin 05/17/2014   Hypertension associated with type 2 diabetes mellitus (HCC) 09/02/2013    Past Surgical History:  Procedure Laterality Date   COLONOSCOPY WITH PROPOFOL  N/A 03/13/2016   Procedure: COLONOSCOPY WITH PROPOFOL ;  Surgeon: Deveron Fly, MD;  Location: [facility] ENDOSCOPY;  Service: Endoscopy;  Laterality:  N/A;   FRACTURE SURGERY Right 1985   upper leg       Home Medications    Prior to Admission medications   Medication Sig Start Date End Date Taking? Authorizing Provider  oxybutynin (DITROPAN-XL) 10 MG 24 hr tablet Take 10 mg by mouth. 04/22/23  Yes [provider]  atorvastatin (LIPITOR) 10 MG tablet Take 10 mg by mouth daily. 04/28/18 04/28/19  [provider]  lisinopril (ZESTRIL) 10 MG tablet TAKE 1 TABLET BY MOUTH ONCE DAILY FOR BLOOD PRESSURE 12/31/18   [provider]  metFORMIN (GLUCOPHAGE) 1000 MG tablet Take by mouth.    [provider]  triamterene-hydrochlorothiazide (DYAZIDE) 37.5-25 MG capsule Take 1 capsule by mouth daily.    [provider]    Family History History reviewed. No pertinent family history.  Social History Social History   Tobacco Use   Smoking status: Former    Current packs/day: 0.25    Average packs/day: 0.3 packs/day for 60.0 years (15.0 ttl pk-yrs)    Types: Cigars, Cigarettes   Smokeless tobacco: Current    Types: Chew  Vaping Use   Vaping status: Never Used  Substance Use Topics   Alcohol use: Yes    Alcohol/week: 2.0 standard drinks of alcohol    Types: 2 Shots of liquor per week   Drug use: No     Allergies   Patient has no known allergies.   Review of Systems Review of Systems  Constitutional:  Negative for chills and fever.  Musculoskeletal:  Positive for arthralgias and gait problem. Negative for joint swelling.  Skin:  Negative for color change, rash and wound.  Neurological:  Negative for weakness and numbness.     Physical Exam Triage Vital Signs ED Triage Vitals  Encounter Vitals Group     BP 10/26/23 1301 130/76     Girls Systolic BP Percentile --      Girls Diastolic BP Percentile --      Boys Systolic BP Percentile --      Boys Diastolic BP Percentile --      Pulse Rate 10/26/23 1301 63     Resp 10/26/23 1301 18     Temp 10/26/23 1301 98.2 F (36.8 C)      Temp src --      SpO2 10/26/23 1301 95 %     Weight --      Height --      Head Circumference --      Peak Flow --      Pain Score 10/26/23 1302 10     Pain Loc --      Pain Education --      Exclude from Growth Chart --    No data found.  Updated Vital Signs BP 130/76   Pulse 63   Temp 98.2 F (36.8 C)   Resp 18   SpO2 95%   Visual Acuity Right Eye Distance:   Left Eye Distance:   Bilateral Distance:    Right Eye Near:   Left Eye Near:    Bilateral Near:     Physical Exam Constitutional:      General: He is not in acute distress. HENT:     Mouth/Throat:     Mouth: Mucous membranes are moist.   Cardiovascular:     Rate and Rhythm: Normal rate and regular rhythm.  Pulmonary:     Effort: Pulmonary effort is normal. No respiratory distress.   Musculoskeletal:        General: Tenderness present. No swelling or deformity. Normal range of motion.       Legs:   Skin:    General: Skin is warm and dry.     Capillary Refill: Capillary refill takes less than 2 seconds.     Findings: No bruising, erythema, lesion or rash.   Neurological:     General: No focal deficit present.     Mental Status: He is alert.     Sensory: No sensory deficit.     Motor: No weakness.     Gait: Gait abnormal.     Comments: In wheelchair but able to stand and bear weight.     UC Treatments / Results  Labs (all labs ordered are listed, but only abnormal results are displayed) Labs Reviewed - No data to display  EKG   Radiology DG Hip Unilat With Pelvis 2-3 Views Right Result Date: 10/26/2023 CLINICAL DATA:  Right hip pain 2 weeks. EXAM: DG HIP (WITH OR WITHOUT PELVIS) 2-3V RIGHT COMPARISON:  CT 03/29/2016 FINDINGS: Mild symmetric osteoarthritic change of the hips. No evidence of fracture or dislocation. Right femoral intramedullary nail intact. Degenerative change of the spine. IMPRESSION: 1. No acute findings. 2. Mild symmetric osteoarthritic change of the hips. Right femoral  intramedullary nail intact. Electronically Signed   By: Roda Cirri M.D.   On: 10/26/2023 14:07    Procedures Procedures (including critical care time)  Medications Ordered in UC Medications - No data to display  Initial Impression / Assessment and Plan /  UC Course  I have reviewed the triage vital signs and the nursing notes.  Pertinent labs & imaging results that were available during my care of the patient were reviewed by me and considered in my medical decision making (see chart for details).    Right hip pain. Xray negative for acute abnormality.  Patient has been taking ibuprofen for his pain and reports it has helped.  Instructed him to continue Tylenol or ibuprofen as needed for discomfort.  Education provided on hip pain.  Instructed him to follow-up with an orthopedist.  Contact information for on-call Ortho provided.  He agrees to plan of care.    Final Clinical Impressions(s) / UC Diagnoses   Final diagnoses:  Right hip pain     Discharge Instructions      Follow up with an orthopedist such as the one listed below.    Take Tylenol or ibuprofen for discomfort.    Your xray is pending.  I will call you with the result.       ED Prescriptions   None    PDMP not reviewed this encounter.   Wellington Half, NP 10/26/23 [phone]

## 2023-10-26 NOTE — ED Triage Notes (Signed)
 Patient to Urgent Care with complaints of right sided hip pain. Symptoms x2 weeks. Reports pain started with cramping pain in the back of his thigh and radiated into his hip- sharp.  Denies any known injury. Taking ibuprofen.  Reports surgery in 1985.

## 2023-10-26 NOTE — Discharge Instructions (Addendum)
 Follow up with an orthopedist such as the one listed below.    Take Tylenol or ibuprofen for discomfort.    Your xray is pending.  I will call you with the result.

## 2023-10-28 ENCOUNTER — Ambulatory Visit (HOSPITAL_COMMUNITY): Payer: Self-pay
